# Patient Record
Sex: Male | Born: 1985 | Race: White | Hispanic: Yes | Marital: Single | Smoking: Never smoker
Health system: Southern US, Community
[De-identification: ages and names within clinical notes are randomized; demographics above are authoritative.]

---

## 2016-08-31 ENCOUNTER — Emergency Department (HOSPITAL_COMMUNITY)
Admission: EM | Admit: 2016-08-31 | Discharge: 2016-08-31 | Disposition: A | Discharge status: Home / Self Care | Payer: Self-pay | Attending: Emergency Medicine | Admitting: Emergency Medicine

## 2016-08-31 ENCOUNTER — Encounter (HOSPITAL_COMMUNITY): Payer: Self-pay | Admitting: *Deleted

## 2016-08-31 ENCOUNTER — Ambulatory Visit (HOSPITAL_COMMUNITY)
Admission: EM | Admit: 2016-08-31 | Discharge: 2016-08-31 | Disposition: A | Discharge status: Home / Self Care | Payer: Self-pay

## 2016-08-31 ENCOUNTER — Emergency Department (HOSPITAL_COMMUNITY): Payer: Self-pay

## 2016-08-31 DIAGNOSIS — J189 Pneumonia, unspecified organism: Secondary | ICD-10-CM

## 2016-08-31 DIAGNOSIS — J181 Lobar pneumonia, unspecified organism: Secondary | ICD-10-CM | POA: Insufficient documentation

## 2016-08-31 LAB — CBC
HEMATOCRIT: 47.7 % (ref 39.0–52.0)
Hemoglobin: 16.3 g/dL (ref 13.0–17.0)
MCH: 29.4 pg (ref 26.0–34.0)
MCHC: 34.2 g/dL (ref 30.0–36.0)
MCV: 85.9 fL (ref 78.0–100.0)
PLATELETS: 199 10*3/uL (ref 150–400)
RBC: 5.55 MIL/uL (ref 4.22–5.81)
RDW: 13 % (ref 11.5–15.5)
WBC: 6.2 10*3/uL (ref 4.0–10.5)

## 2016-08-31 LAB — I-STAT TROPONIN, ED: Troponin i, poc: 0 ng/mL (ref 0.00–0.08)

## 2016-08-31 LAB — BASIC METABOLIC PANEL
Anion gap: 9 (ref 5–15)
BUN: 11 mg/dL (ref 6–20)
CO2: 25 mmol/L (ref 22–32)
Calcium: 9.2 mg/dL (ref 8.9–10.3)
Chloride: 102 mmol/L (ref 101–111)
Creatinine, Ser: 0.93 mg/dL (ref 0.61–1.24)
GFR calc Af Amer: >60 mL/min (ref >60)
Glucose, Bld: 85 mg/dL (ref 65–99)
POTASSIUM: 3.8 mmol/L (ref 3.5–5.1)
SODIUM: 136 mmol/L (ref 135–145)

## 2016-08-31 LAB — D-DIMER, QUANTITATIVE (NOT AT ARMC): D DIMER QUANT: 0.42 ug{FEU}/mL (ref 0.00–0.50)

## 2016-08-31 MED ORDER — AZITHROMYCIN 250 MG PO TABS: ORAL_TABLET | ORAL | 0 refills | Status: AC

## 2016-08-31 MED ORDER — AZITHROMYCIN 250 MG PO TABS
500.0000 mg | ORAL_TABLET | Freq: Once | ORAL | Status: AC
  Administered 2016-08-31: 500 mg via ORAL
  Filled 2016-08-31: qty 2

## 2016-08-31 NOTE — ED Triage Notes (Signed)
Pt states chest pain since Sunday.  Drank 4 beers and at hot chips on sat.  Pain initially was central, but now is L sided. And is accompanied by sob and nausea.

## 2016-08-31 NOTE — ED Notes (Signed)
PA at bedside.

## 2016-08-31 NOTE — ED Notes (Signed)
Patient ambulated approximately 13150ft. SpO2 remained between 97-100% throughout. Heart rate increased to 96bpm from 67bpm upon standing initially.

## 2016-08-31 NOTE — ED Provider Notes (Signed)
[facility]-EMERGENCY DEPT Provider Note   CSN: [id number] Arrival date & time: 08/31/16  1428     History   Chief Complaint Chief Complaint  Patient presents with  . Chest Pain    HPI Samuel Garrison is a 31 y.o. male.  HPI   31 year old male presenting complaining of chest pain. History obtained through girlfriend who is at bedside. Patient is Spanish-speaking. I did offer hourly which interpreter but patient declined. For the past 4 days patient has complaining of left-sided chest pain, described as a pressure or sob sensation with associated chills, weakness, and pleuritic chest pain. Pain is nonradiating, moderate intensity, nothing seems to make it better or worse. No report of fever or productive cough. Does endorse shortness of breath with this chest pain. Denies any prior history of PE or DVT, no recent surgery, prolonged bed rest, UR leg swelling or calf pain, active cancer of hemoptysis. Does report family history of cardiac disease but no premature cardiac death. Patient is a nonsmoker. He admits to drinking alcohol on occasion but admits to binge drinking at times. Last alcohol consumption was 5 days ago but denies any lack of episode or aspiration. Does complain of having heartburn but no active heartburn at this time. No URI complaint. No lightheadedness dizziness, diaphoresis, or exertional chest pain.  History reviewed. No pertinent past medical history.  There are no active problems to display for this patient.   History reviewed. No pertinent surgical history.     Home Medications    Prior to Admission medications   Not on File    Family History No family history on file.  Social History Social History  Substance Use Topics  . Smoking status: Never Smoker  . Smokeless tobacco: Never Used  . Alcohol use Yes     Comment: occ binge drinking     Allergies   Patient has no known allergies.   Review of Systems Review of Systems  All other systems reviewed  and are negative.    Physical Exam Updated Vital Signs BP 132/65 (BP Location: Left Arm)   Pulse 60   Temp 98 F (36.7 C) (Oral)   Resp 16   Ht 5' 6.53" (1.69 m)   Wt 75.8 kg   SpO2 100%   BMI 26.52 kg/m   Physical Exam  Constitutional: He appears well-developed and well-nourished. No distress.  HENT:  Head: Atraumatic.  Eyes: Conjunctivae are normal.  Neck: Neck supple.  Cardiovascular: Normal rate, regular rhythm and intact distal pulses.   Pulmonary/Chest: Effort normal and breath sounds normal.  Abdominal: Soft. Bowel sounds are normal. He exhibits no distension. There is no tenderness.  Musculoskeletal: He exhibits no edema.  Neurological: He is alert.  Skin: No rash noted.  Psychiatric: He has a normal mood and affect.  Nursing note and vitals reviewed.    ED Treatments / Results  Labs (all labs ordered are listed, but only abnormal results are displayed) Labs Reviewed  BASIC METABOLIC PANEL  CBC  D-DIMER, QUANTITATIVE (NOT AT [facility])  I-STAT TROPOININ, ED    EKG  EKG Interpretation  Date/Time:  Wednesday August 31 2016 14:44:38 EST Ventricular Rate:  68 PR Interval:  130 QRS Duration: 126 QT Interval:  390 QTC Calculation: 414 R Axis:   85 Text Interpretation:  Normal sinus rhythm Right bundle branch block Abnormal ECG No old tracing to compare Confirmed by [facility]  MD, DAVID ([id number]) on 08/31/2016 5:08:59 PM       Radiology Dg Chest 2  View  Result Date: 08/31/2016 CLINICAL DATA:  Chest pain and shortness of breath for several days EXAM: CHEST  2 VIEW COMPARISON:  None. FINDINGS: Cardiac shadow is within normal limits. There is infiltrative density noted posteriorly on the lateral projection which appears to project in the left retrocardiac region. No bony abnormality is seen. IMPRESSION: Posterior lower lobe infiltrate most likely in the left retrocardiac region Electronically Signed   By: Alcide CleverMark  Lukens M.D.   On: 08/31/2016 15:09     Procedures Procedures (including critical care time)  Medications Ordered in ED Medications - No data to display   Initial Impression / Assessment and Plan / ED Course  I have reviewed the triage vital signs and the nursing notes.  Pertinent labs & imaging results that were available during my care of the patient were reviewed by me and considered in my medical decision making (see chart for details).     BP 118/78   Pulse 61   Temp 98 F (36.7 C) (Oral)   Resp 15   Ht 5' 6.53" (1.69 m)   Wt 75.8 kg   SpO2 98%   BMI 26.52 kg/m    Final Clinical Impressions(s) / ED Diagnoses   Final diagnoses:  Community acquired pneumonia of left lower lobe of lung (HCC)    New Prescriptions New Prescriptions   AZITHROMYCIN (ZITHROMAX Z-PAK) 250 MG TABLET    2 po day one, then 1 daily x 4 days   6:29 PM Hispanic male presents complaining of pleuritic chest pain for the past 3 days with shortness of breath. No fever or productive cough. EKG shows right bundle branch block. Chest x-ray with a focal infiltrate to the left posterior retrocardiac region. Although patient does not have any significant risk factor for PE, I would like to rule out for PE with a d-dimer. Symptoms not likely ACS. Heart score is 1, low risk of MACE.   7:36 PM Patient's d-dimer is negative. Therefore, patient will be treated for pneumonia with Z-Pak for CAP, since no recent hospitalization . Return precaution discussed.   Fayrene HelperBowie Dilia Alemany, PA-C 08/31/16 1941    Marily MemosJason Mesner, MD 08/31/16 [phone]

## 2017-08-18 ENCOUNTER — Encounter (HOSPITAL_COMMUNITY): Payer: Self-pay | Admitting: Emergency Medicine

## 2017-08-18 ENCOUNTER — Other Ambulatory Visit: Payer: Self-pay

## 2017-08-18 ENCOUNTER — Emergency Department (HOSPITAL_COMMUNITY)
Admission: EM | Admit: 2017-08-18 | Discharge: 2017-08-18 | Disposition: A | Discharge status: Home / Self Care | Payer: Self-pay | Attending: Emergency Medicine | Admitting: Emergency Medicine

## 2017-08-18 ENCOUNTER — Emergency Department (HOSPITAL_COMMUNITY): Payer: Self-pay

## 2017-08-18 DIAGNOSIS — S91331A Puncture wound without foreign body, right foot, initial encounter: Secondary | ICD-10-CM

## 2017-08-18 DIAGNOSIS — Y929 Unspecified place or not applicable: Secondary | ICD-10-CM | POA: Insufficient documentation

## 2017-08-18 DIAGNOSIS — Y999 Unspecified external cause status: Secondary | ICD-10-CM | POA: Insufficient documentation

## 2017-08-18 DIAGNOSIS — Y939 Activity, unspecified: Secondary | ICD-10-CM | POA: Insufficient documentation

## 2017-08-18 DIAGNOSIS — W450XXA Nail entering through skin, initial encounter: Secondary | ICD-10-CM | POA: Insufficient documentation

## 2017-08-18 MED ORDER — IBUPROFEN 800 MG PO TABS: 800.0000 mg | ORAL_TABLET | Freq: Three times a day (TID) | ORAL | 0 refills | Status: AC

## 2017-08-18 MED ORDER — SULFAMETHOXAZOLE-TRIMETHOPRIM 800-160 MG PO TABS: 1.0000 | ORAL_TABLET | Freq: Two times a day (BID) | ORAL | 0 refills | Status: AC

## 2017-08-18 NOTE — ED Provider Notes (Signed)
[facility] EMERGENCY DEPARTMENT Provider Note   CSN: [id number] Arrival date & time: 08/18/17  1455     History   Chief Complaint Chief Complaint  Patient presents with  . Foot Pain    HPI Samuel Garrison is a 32 y.o. male.  The history is provided by the patient. No language interpreter was used.  Foot Pain  This is a new problem. The current episode started 6 to 12 hours ago. The problem occurs constantly. Nothing aggravates the symptoms. Nothing relieves the symptoms. He has tried nothing for the symptoms. The treatment provided no relief.  Pt stepped on a nail.  Pt complains of pain to the bottom of his foot.  Pt's tetanus was less than 5 years ago  History reviewed. No pertinent past medical history.  There are no active problems to display for this patient.   History reviewed. No pertinent surgical history.     Home Medications    Prior to Admission medications   Medication Sig Start Date End Date Taking? Authorizing Provider  aspirin-sod bicarb-citric acid (ALKA-SELTZER) 325 MG TBEF tablet Take 325 mg by mouth every 6 (six) hours as needed (for indigestion).    [provider]  azithromycin (ZITHROMAX Z-PAK) 250 MG tablet 2 po day one, then 1 daily x 4 days 08/31/16   Fayrene Helper, PA-C    Family History History reviewed. No pertinent family history.  Social History Social History   Tobacco Use  . Smoking status: Never Smoker  . Smokeless tobacco: Never Used  Substance Use Topics  . Alcohol use: Yes    Comment: occ binge drinking  . Drug use: No     Allergies   Patient has no known allergies.   Review of Systems Review of Systems  All other systems reviewed and are negative.    Physical Exam Updated Vital Signs BP 128/71 (BP Location: Left Arm)   Pulse 61   Temp 98.6 F (37 C) (Oral)   Resp 18   SpO2 100%   Physical Exam  Constitutional: He appears well-developed and well-nourished.  HENT:  Head:  Normocephalic.  Musculoskeletal: Normal range of motion. He exhibits tenderness.  Puncture wound foot,  nv and ns intact,  Tender, no erythema  Neurological: He is alert.  Skin: Skin is warm.  Psychiatric: He has a normal mood and affect.  Nursing note and vitals reviewed.    ED Treatments / Results  Labs (all labs ordered are listed, but only abnormal results are displayed) Labs Reviewed - No data to display  EKG  EKG Interpretation None       Radiology Dg Foot Complete Right  Result Date: 08/18/2017 CLINICAL DATA:  Redness and swelling. EXAM: RIGHT FOOT COMPLETE - 3+ VIEW COMPARISON:  No recent. FINDINGS: No acute bony or joint abnormality identified. No evidence of fracture or dislocation. IMPRESSION: No acute abnormality. Electronically Signed   By: Maisie Fus  Register   On: 08/18/2017 15:56    Procedures Procedures (including critical care time)  Medications Ordered in ED Medications - No data to display   Initial Impression / Assessment and Plan / ED Course  I have reviewed the triage vital signs and the nursing notes.  Pertinent labs & imaging results that were available during my care of the patient were reviewed by me and considered in my medical decision making (see chart for details).     MDM  Foot xray reviewed,  No frature, no foreign body.  Pt counseled on risk  of infection He is advised to soak area 20 minutes 4 times a day  Bactrim po x 7 days  Post op shoe   Final Clinical Impressions(s) / ED Diagnoses   Final diagnoses:  Puncture wound of right foot, initial encounter    ED Discharge Orders        Ordered    sulfamethoxazole-trimethoprim (BACTRIM DS,SEPTRA DS) 800-160 MG tablet  2 times daily     08/18/17 1634    ibuprofen (ADVIL,MOTRIN) 800 MG tablet  3 times daily     08/18/17 1634    An After Visit Summary was printed and given to the patient.   Elson AreasSofia, Raeanne Deschler K, PA-C 08/18/17 1635    Shaune PollackIsaacs, Cameron, MD 08/19/17 [phone]

## 2017-08-18 NOTE — ED Notes (Signed)
PT states understanding of care given, follow up care, and medication prescribed. PT ambulated from ED to car with a steady gait. 

## 2017-08-18 NOTE — Discharge Instructions (Signed)
Recheck at Urgent care in 2 days.  Soak foot 20 minutes 4 times a day

## 2017-08-18 NOTE — ED Triage Notes (Signed)
Pt states that he stepped on a nail this morning.  Pt is now having redness, swelling and pain to area and cannot bear weight on that foot due to pain.  Rt foot.

## 2019-10-08 IMAGING — DX DG FOOT COMPLETE 3+V*R*
3 series · 3 of 3 positions shown · non-contrast
Comparison: No recent.

CLINICAL DATA: Redness and swelling.

EXAM:
RIGHT FOOT COMPLETE - 3+ VIEW

[foot ap]
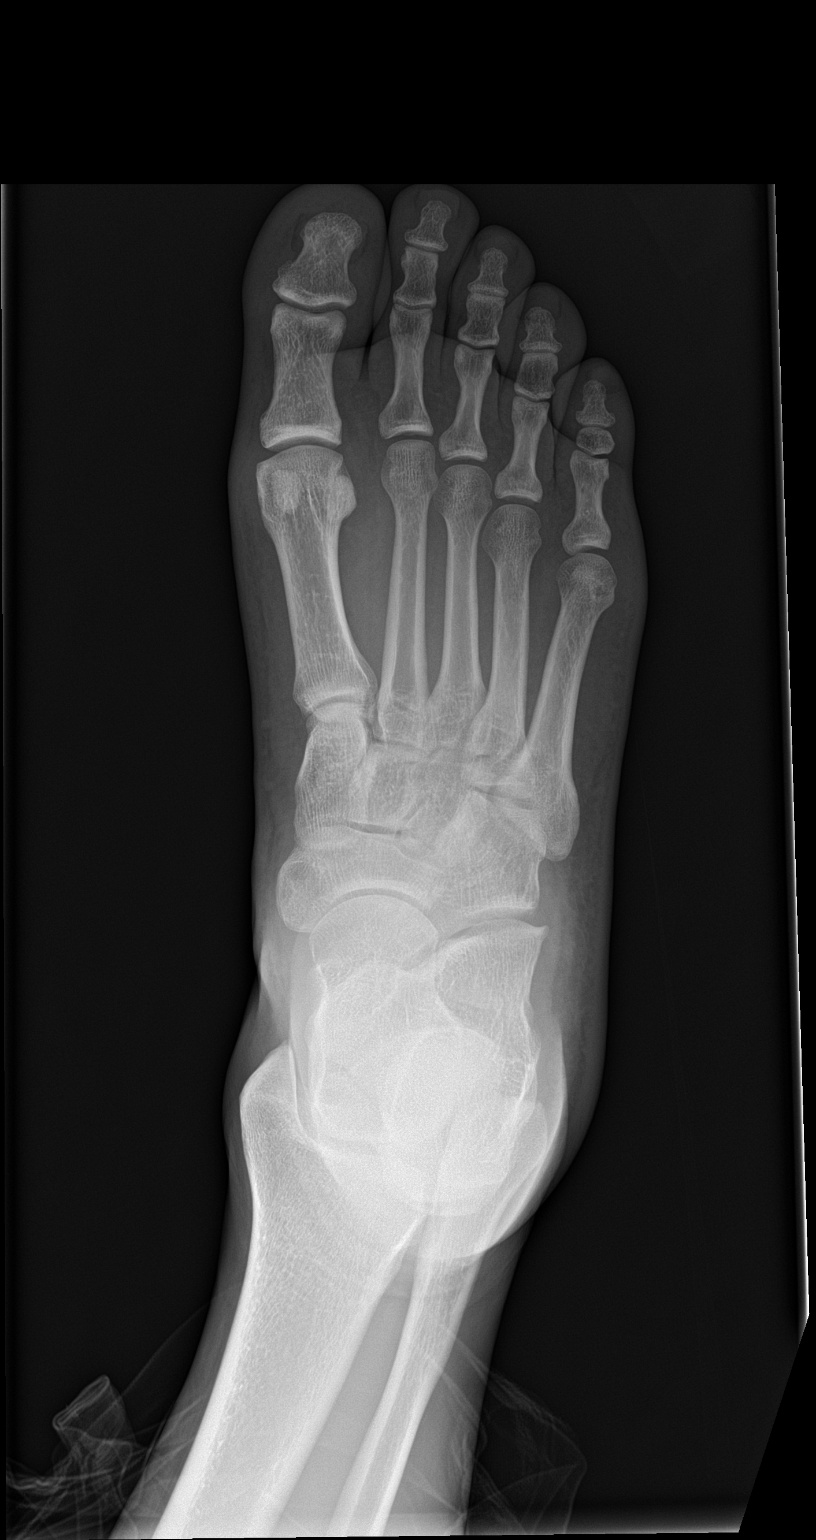

[foot obl]
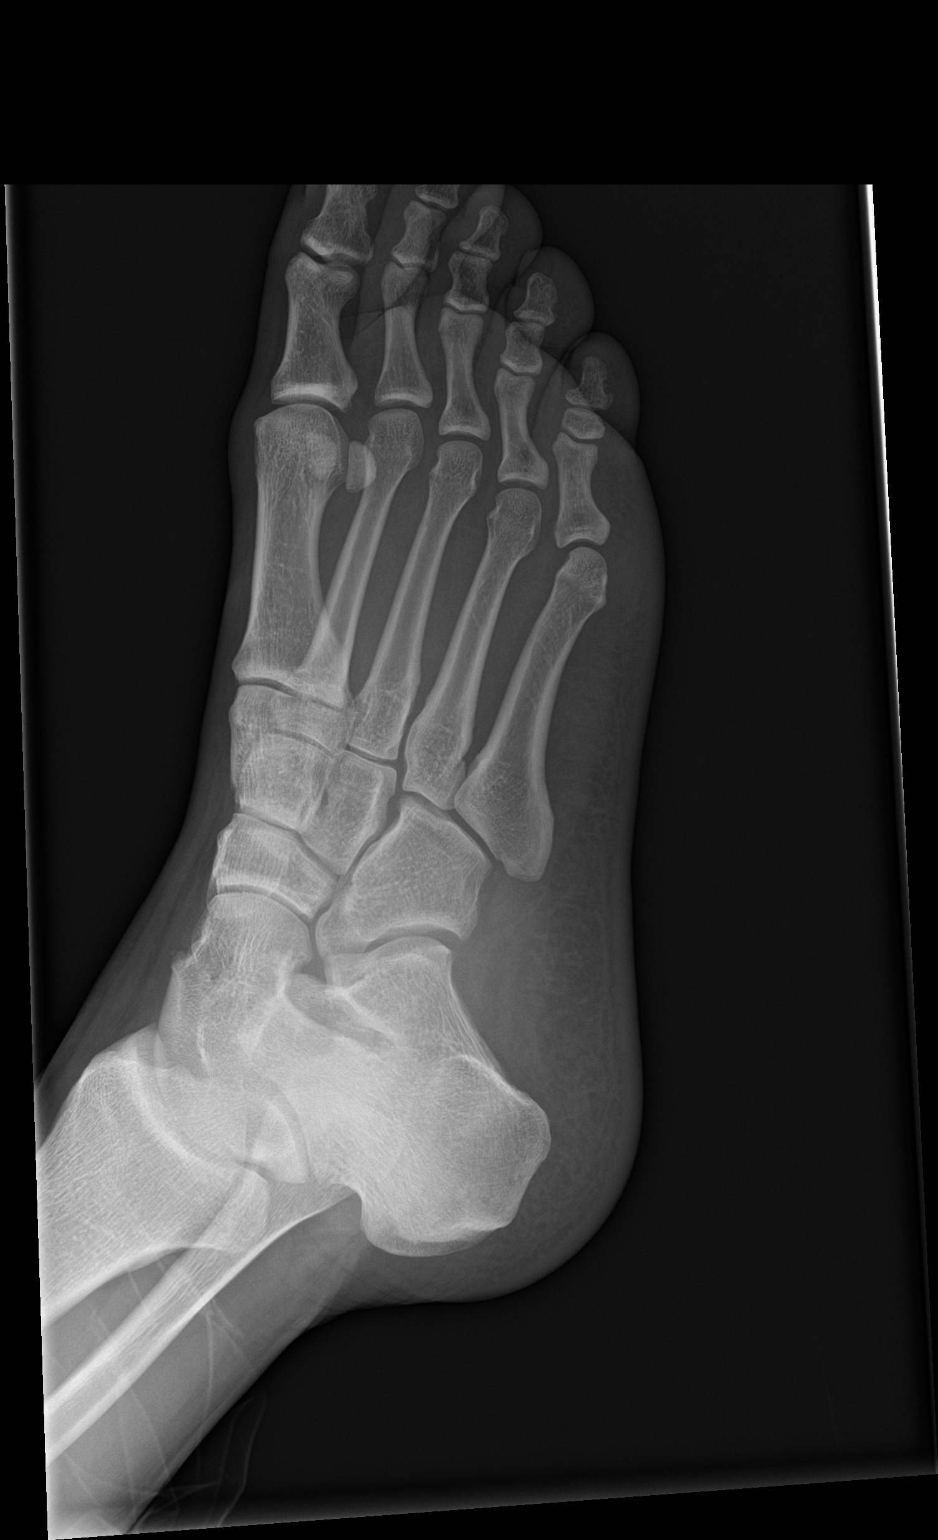

[foot lat]
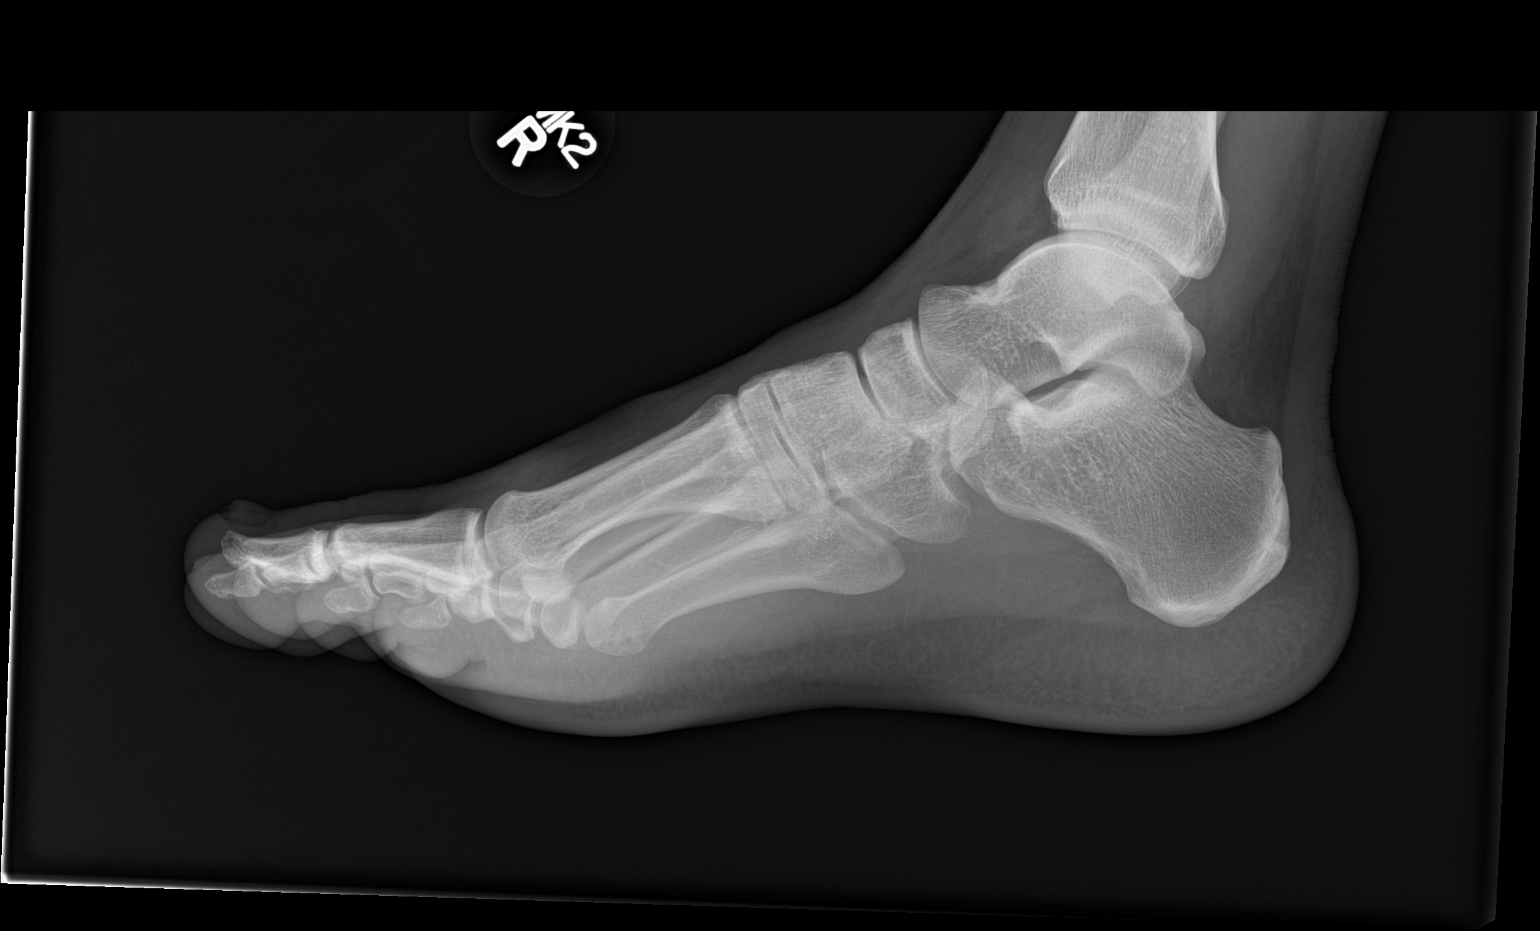

[3 of 3 positions shown; findings below may reference images not displayed]

FINDINGS: No acute bony or joint abnormality identified. No evidence of
fracture or dislocation.
IMPRESSION: No acute abnormality.
